# Patient Record
Sex: Male | Born: 1960 | Race: White | Hispanic: No | Marital: Married | Smoking: Former smoker
Health system: Southern US, Community
[De-identification: ages and names within clinical notes are randomized; demographics above are authoritative.]

## PROBLEM LIST (undated history)

## (undated) HISTORY — PX: FOOT SURGERY: SHX648

---

## 2011-10-09 ENCOUNTER — Emergency Department (HOSPITAL_COMMUNITY): Payer: Self-pay

## 2011-10-09 ENCOUNTER — Emergency Department (HOSPITAL_COMMUNITY)
Admission: EM | Admit: 2011-10-09 | Discharge: 2011-10-09 | Disposition: A | Discharge status: Home / Self Care | Payer: Self-pay | Attending: Emergency Medicine | Admitting: Emergency Medicine

## 2011-10-09 ENCOUNTER — Encounter (HOSPITAL_COMMUNITY): Payer: Self-pay

## 2011-10-09 DIAGNOSIS — M545 Low back pain, unspecified: Secondary | ICD-10-CM | POA: Insufficient documentation

## 2011-10-09 DIAGNOSIS — R300 Dysuria: Secondary | ICD-10-CM | POA: Insufficient documentation

## 2011-10-09 DIAGNOSIS — N509 Disorder of male genital organs, unspecified: Secondary | ICD-10-CM | POA: Insufficient documentation

## 2011-10-09 LAB — URINALYSIS, ROUTINE W REFLEX MICROSCOPIC
Hgb urine dipstick: NEGATIVE
Leukocytes, UA: NEGATIVE
Protein, ur: NEGATIVE mg/dL
Specific Gravity, Urine: <1.005 — ABNORMAL LOW (ref 1.005–1.030)
Urobilinogen, UA: 0.2 mg/dL (ref 0.0–1.0)

## 2011-10-09 LAB — CBC
HCT: 44.5 % (ref 39.0–52.0)
Hemoglobin: 15.2 g/dL (ref 13.0–17.0)
MCH: 31.1 pg (ref 26.0–34.0)
MCHC: 34.2 g/dL (ref 30.0–36.0)
MCV: 91.2 fL (ref 78.0–100.0)
RDW: 12.7 % (ref 11.5–15.5)

## 2011-10-09 LAB — DIFFERENTIAL
Basophils Absolute: 0 10*3/uL (ref 0.0–0.1)
Basophils Relative: 1 % (ref 0–1)
Eosinophils Absolute: 0.2 10*3/uL (ref 0.0–0.7)
Eosinophils Relative: 3 % (ref 0–5)
Monocytes Absolute: 0.4 10*3/uL (ref 0.1–1.0)
Monocytes Relative: 7 % (ref 3–12)

## 2011-10-09 LAB — BASIC METABOLIC PANEL
BUN: 8 mg/dL (ref 6–23)
CO2: 28 mEq/L (ref 19–32)
Chloride: 100 mEq/L (ref 96–112)
Creatinine, Ser: 0.74 mg/dL (ref 0.50–1.35)
GFR calc Af Amer: >90 mL/min (ref >90)
Glucose, Bld: 126 mg/dL — ABNORMAL HIGH (ref 70–99)
Potassium: 3.9 mEq/L (ref 3.5–5.1)

## 2011-10-09 MED ORDER — DOXYCYCLINE HYCLATE 100 MG PO TABS
100.0000 mg | ORAL_TABLET | Freq: Once | ORAL | Status: AC
  Administered 2011-10-09: 100 mg via ORAL
  Filled 2011-10-09: qty 1

## 2011-10-09 MED ORDER — SODIUM CHLORIDE 0.9 % IV SOLN
INTRAVENOUS | Status: DC
  Administered 2011-10-09: 16:00:00 via INTRAVENOUS

## 2011-10-09 MED ORDER — DOXYCYCLINE HYCLATE 100 MG PO TABS: 100.0000 mg | ORAL_TABLET | Freq: Two times a day (BID) | ORAL | Status: AC

## 2011-10-09 MED ORDER — OXYCODONE-ACETAMINOPHEN 5-325 MG PO TABS: ORAL_TABLET | ORAL | Status: AC

## 2011-10-09 MED ORDER — CEFTRIAXONE SODIUM 250 MG IJ SOLR
250.0000 mg | Freq: Once | INTRAMUSCULAR | Status: AC
  Administered 2011-10-09: 250 mg via INTRAMUSCULAR
  Filled 2011-10-09: qty 250

## 2011-10-09 MED ORDER — LIDOCAINE HCL (PF) 1 % IJ SOLN
INTRAMUSCULAR | Status: AC
  Administered 2011-10-09: 2.1 mL
  Filled 2011-10-09: qty 5

## 2011-10-09 MED ORDER — MORPHINE SULFATE 4 MG/ML IJ SOLN
4.0000 mg | INTRAMUSCULAR | Status: DC | PRN
  Administered 2011-10-09: 4 mg via INTRAVENOUS
  Filled 2011-10-09: qty 1

## 2011-10-09 NOTE — ED Notes (Signed)
C/o lower back and testicle pain x 2 weeks and dysuria along with hematuria. Pt appears uncomfortable in triage.

## 2011-10-09 NOTE — ED Notes (Signed)
Pt resting quietly on stretcher. States that he can tolerate the pain now and it is better. Waiting on CT.

## 2011-10-09 NOTE — ED Provider Notes (Signed)
History   This chart was scribed for Antonio Anger, DO by Brooks Sailors. The patient was seen in room APA07/APA07. Patient's care was started at 1345.   CSN: [id number]  Arrival date & time 10/09/11  1345   First MD Initiated Contact with Patient 10/09/11 1508      Chief Complaint  Patient presents with  . Hematuria  . Testicle Pain  . Back Pain     HPI Pt seen at 1515.  Per pt, c/o gradual onset and persistence of constant dysuria for the past 2 weeks.  Has been associated with testicular "pain."  Describes the pain as "burning" when he urinates which radiates into his low back and hips.  Pt states he was eval by his PMD 2 days ago for same, does not recall what his doctor dx him with.  Denies abd pain, no N/V/D, no fevers, no rash, no hematuria, no injury.     History reviewed. No pertinent past medical history.  Past Surgical History  Procedure Date  . Foot surgery     left    History  Substance Use Topics  . Smoking status: Current Everyday Smoker  . Smokeless tobacco: Not on file  . Alcohol Use: No      Review of Systems ROS: Statement: All systems negative except as marked or noted in the HPI; Constitutional: Negative for fever and chills. ; ; Eyes: Negative for eye pain, redness and discharge. ; ; ENMT: Negative for ear pain, hoarseness, nasal congestion, sinus pressure and sore throat. ; ; Cardiovascular: Negative for chest pain, palpitations, diaphoresis, dyspnea and peripheral edema. ; ; Respiratory: Negative for cough, wheezing and stridor. ; ; Gastrointestinal: Negative for nausea, vomiting, diarrhea, abdominal pain, blood in stool, hematemesis, jaundice and rectal bleeding. . ; ; Genitourinary: +dysuria.  Negative for flank pain and hematuria. ; Genital:  No penile drainage or rash, +L>R testicular pain and swelling, no scrotal rash or swelling ; Musculoskeletal: +LBP.  Negative for neck pain. Negative for swelling and trauma.; ; Skin: Negative for pruritus,  rash, abrasions, blisters, bruising and skin lesion.; ; Neuro: Negative for headache, lightheadedness and neck stiffness. Negative for weakness, altered level of consciousness , altered mental status, extremity weakness, paresthesias, involuntary movement, seizure and syncope.      Allergies  Fentanyl  Home Medications   Current Outpatient Rx  Name Route Sig Dispense Refill  . CLONAZEPAM 0.5 MG PO TABS Oral Take 0.5 mg by mouth 2 (two) times daily.    . SERTRALINE HCL 50 MG PO TABS Oral Take 50 mg by mouth 2 (two) times daily.      BP 129/69  Pulse 101  Temp 97.6 F (36.4 C) (Oral)  Resp 18  Ht 5\' 11"  (1.803 m)  Wt 157 lb (71.215 kg)  BMI 21.90 kg/m2  SpO2 97%  Physical Exam 1520: Physical examination:  Nursing notes reviewed; Vital signs and O2 SAT reviewed;  Constitutional: Well developed, Well nourished, Well hydrated, Uncomfortable appearing; Head:  Normocephalic, atraumatic; Eyes: EOMI, PERRL, No scleral icterus; ENMT: Mouth and pharynx normal, Mucous membranes moist; Neck: Supple, Full range of motion, No lymphadenopathy; Cardiovascular: Regular rate and rhythm, No murmur, rub, or gallop; Respiratory: Breath sounds clear & equal bilaterally, No rales, rhonchi, wheezes.  Speaking full sentences with ease, Normal respiratory effort/excursion; Chest: Nontender, Movement normal; Abdomen: Soft, Nontender, Nondistended, Normal bowel sounds; Genitourinary: No CVA tenderness; Genital and rectal exam performed with pt permission and male ED RN chaparone present during exam.  No perineal tenderness, erythema, edema, ecchymosis, or soft tissue crepitus.  No penile lesions or drainage.  No scrotal erythema, edema or tenderness to palp.  Normal testicular lie.  No testicular tenderness to palp.  +cremasteric reflexes bilat.  No inguinal LAN or palpable masses.  Rectal exam performed w/permission of pt and ED RN chaparone present.  Anal tone normal. Prostate NT on rectal exam.  Non-tender, soft  brown stool in rectal vault, heme neg.  No fissures, no external hemorrhoids, no palp masses.;;  Spine:  No midline CS, TS, LS tenderness.; Extremities: Pulses normal, No tenderness, No edema, No calf edema or asymmetry.; Neuro: AA&Ox3, Major CN grossly intact.  Speech clear. Normal coordination, gait steady.  Climbs on and off stretcher without difficulty or distress.  No gross focal motor or sensory deficits in extremities.; Skin: Color normal, Warm, Dry.   ED Course  Procedures   MDM  MDM Reviewed: nursing note and vitals Interpretation: labs, ultrasound and CT scan   Results for orders placed during the hospital encounter of 10/09/11  URINALYSIS, ROUTINE W REFLEX MICROSCOPIC      Component Value Range   Color, Urine YELLOW  YELLOW   APPearance CLEAR  CLEAR   Specific Gravity, Urine <1.005 (*) 1.005 - 1.030   pH 6.0  5.0 - 8.0   Glucose, UA NEGATIVE  NEGATIVE mg/dL   Hgb urine dipstick NEGATIVE  NEGATIVE   Bilirubin Urine NEGATIVE  NEGATIVE   Ketones, ur NEGATIVE  NEGATIVE mg/dL   Protein, ur NEGATIVE  NEGATIVE mg/dL   Urobilinogen, UA 0.2  0.0 - 1.0 mg/dL   Nitrite NEGATIVE  NEGATIVE   Leukocytes, UA NEGATIVE  NEGATIVE  BASIC METABOLIC PANEL      Component Value Range   Sodium 137  135 - 145 mEq/L   Potassium 3.9  3.5 - 5.1 mEq/L   Chloride 100  96 - 112 mEq/L   CO2 28  19 - 32 mEq/L   Glucose, Bld 126 (*) 70 - 99 mg/dL   BUN 8  6 - 23 mg/dL   Creatinine, Ser 1.61  0.50 - 1.35 mg/dL   Calcium 9.7  8.4 - 09.6 mg/dL   GFR calc non Af Amer >90  >90 mL/min   GFR calc Af Amer >90  >90 mL/min  CBC      Component Value Range   WBC 6.6  4.0 - 10.5 K/uL   RBC 4.88  4.22 - 5.81 MIL/uL   Hemoglobin 15.2  13.0 - 17.0 g/dL   HCT 04.5  40.9 - 81.1 %   MCV 91.2  78.0 - 100.0 fL   MCH 31.1  26.0 - 34.0 pg   MCHC 34.2  30.0 - 36.0 g/dL   RDW 91.4  78.2 - 95.6 %   Platelets 229  150 - 400 K/uL  DIFFERENTIAL      Component Value Range   Neutrophils Relative 61  43 - 77 %    Neutro Abs 4.0  1.7 - 7.7 K/uL   Lymphocytes Relative 30  12 - 46 %   Lymphs Abs 2.0  0.7 - 4.0 K/uL   Monocytes Relative 7  3 - 12 %   Monocytes Absolute 0.4  0.1 - 1.0 K/uL   Eosinophils Relative 3  0 - 5 %   Eosinophils Absolute 0.2  0.0 - 0.7 K/uL   Basophils Relative 1  0 - 1 %   Basophils Absolute 0.0  0.0 - 0.1 K/uL   Ct Abdomen  Pelvis Wo Contrast 10/09/2011  *RADIOLOGY REPORT*  Clinical Data: Hematuria, testicular pain  CT ABDOMEN AND PELVIS WITHOUT CONTRAST  Technique:  Multidetector CT imaging of the abdomen and pelvis was performed following the standard protocol without intravenous contrast.  Comparison: None.  Findings: Lung bases are unremarkable.  Mild degenerative changes lumbar spine.  Schmorl's node deformity noted upper endplate of the L5 and upper endplate of the T12 vertebral body.  Unenhanced liver, spleen, pancreas and adrenals are unremarkable. No calcified gallstones are noted within gallbladder.  Unenhanced kidneys shows no nephrolithiasis.  No hydronephrosis or hydroureter.  There is a probable cyst anterior aspect of the right kidney lower pole measures 3.2 cm.  No calcified ureteral calculi are noted bilaterally.  Mild atherosclerotic calcifications distal abdominal aorta and the iliac arteries.  No aortic aneurysm.  There is no pericecal inflammation.  Normal appendix is partially visualized in axial image 55.  No small bowel obstruction.  No ascites or free air.  No adenopathy.  Moderate distended urinary bladder is noted.  Prostate gland measures 4.9 x 3.3 cm.  No pelvic ascites or adenopathy.  No distal colonic obstruction. No calcified calculi are noted within urinary bladder.  No inguinal adenopathy.  IMPRESSION:  1.  No nephrolithiasis. 2.  No hydronephrosis or hydroureter.  No calcified ureteral calculi are noted. 3.  Normal appendix is partially visualized. 4.  Moderate distended urinary bladder without evidence of calcified calculi.  Original Report Authenticated By:  Natasha Mead, M.D.   US Scrotum 10/09/2011  *RADIOLOGY REPORT*  Clinical Data:  Testicular pain  SCROTAL ULTRASOUND DOPPLER ULTRASOUND OF THE TESTICLES  Technique: Complete ultrasound examination of the testicles, epididymis, and other scrotal structures was performed.  Color and spectral Doppler ultrasound were also utilized to evaluate blood flow to the testicles.  Comparison:  None.  Findings:  Right testis:  Measures 5.3 x 2.2 x 3.5 cm.  No focal abnormality or intratesticular mass.  Normal color Doppler flow with arterial and venous waveforms visualized.  No evidence of torsion.  Left testis:  Measures 4.7 x 1.9 x 3.1 cm.  No focal abnormality or intratesticular mass.  Normal color Doppler flow with arterial and venous waveforms detected.  Negative for torsion.  Right epididymis:  Normal in size and appearance.  Left epididymis:  Normal in size in appearance.  Incidental 3 mm cyst noted.  Hydocele:  Trace simple right hydrocele.  No left hydrocele demonstrated.  Varicocele:  Negative  Pulsed Doppler interrogation of both testes demonstrates low resistance flow bilaterally.  IMPRESSION: No acute finding by ultrasound or evidence of torsion.  Original Report Authenticated By: Judie Petit. Ruel Favors, M.D.     7:03 PM:   Pt states he feels better now and wants to go home.  UC is pending.  No acute findings on CT, [place], or labs today to account for pt's pain.  Will tx for dysuria with IV and PO abx, and f/u with Uro MD.  Dx testing d/w pt and family.  Questions answered.  Verb understanding, agreeable to d/c home with outpt f/u.         I personally performed the services described in this documentation, which was scribed in my presence. The recorded information has been reviewed and considered. Khair Chasteen Allison Quarry, DO 10/12/11 1623

## 2011-10-09 NOTE — ED Notes (Signed)
Pt c/o pain in his left testicle, bilateral hips and lower back. Pt states that is hurts to urinate. Pt alert and oriented x 3. Skin warm and dry. Color pink. Breath sounds clear and equal bilaterally.

## 2011-10-09 NOTE — ED Notes (Signed)
Pt lying on stretcher resting quietly. States that the pain is starting to get a little worse again.

## 2011-10-09 NOTE — Discharge Instructions (Signed)
RESOURCE GUIDE  Chronic Pain Problems: Contact [facility]  [phone] Patients need to be referred by their primary care doctor.  Insufficient Money for Medicine: Contact United Way:  call "211" or [facility] [phone].  No Primary Care Doctor: - Call [facility]  [phone] - can help you locate a primary care doctor that  accepts your insurance, provides certain services, etc. - Physician Referral Service- 1-800-533-3463  Agencies that provide inexpensive medical care: - [facility]  [phone] - [facility]  [phone] - [facility]  [phone] - [facility]  [phone] - Planned Parenthood  [phone] - [facility]  [phone]  Medicaid-accepting [place] Providers: - [facility]- [address]  [phone], Mon-Fri 9am-7pm, Sat 9am-1pm - [facility]- [address], [address]  [phone] - [facility]- [address], [address]  [phone] - [facility]- [address] [place] [address]  [phone] - Veita Bland- [address], [phone]  Only accepts [place] Access Medicaid patients after they have their name  applied to their card  Self Pay (no insurance) in [place]: - Sickle Cell Patients: Dr Eric Dean, [facility]  [address], [phone] - [facility]- [address]  [phone]       -     [facility]- [address]       -     [facility]- see information above (Speak to Pam H if you do not have insurance)       -  [facility]- [address], [phone]       -  [facility] [place]- [address],  [phone]       -  [facility]- [address] [place] [address], [phone]       -  Dr Osei-Bonsu-  [address], [place], [phone]       -  [facility]- [address], [phone]       -  [facility]- [address] [place] [address], [phone], also [address], [phone]       -    [facility]- [address], [phone], 1st & 3rd Saturday   every month, 10am-1pm  1) Find a Doctor and Pay Out of Pocket Although you won't have to find out who is covered by your insurance plan, it is a good idea to ask around and get recommendations. You will then need to call the office and see if the doctor you have chosen will accept you as a new patient and what types of options they offer for patients who are self-pay. Some doctors offer discounts or will set up payment plans for their patients who do not have insurance, but you will need to ask so you aren't surprised when you get to your appointment.  2) Contact Your Local Health Department Not all health departments have doctors that can see patients for sick visits, but many do, so it is worth a call to see if yours does. If you don't know where your local health department is, you can check in your phone book. The CDC also has a tool to help you locate your state's health department, and many state websites also have   listings of all of their local health departments.  3) Find a Walk-in Clinic If your illness is not likely to be very severe or complicated, you may want to try a walk in clinic. These are popping up all over the country in pharmacies, drugstores, and shopping centers. They're usually staffed by nurse practitioners or physician assistants that have been trained to treat common illnesses and complaints. They're usually fairly quick and inexpensive. However, if you have serious medical issues or chronic medical problems, these are probably not your best option  STD Testing - [place] [facility] [place], [facility] Clinic, [address], [place], phone [phone] or [phone].  Monday - Friday, call for an appointment. - [place]  [facility] [place], [facility]D Clinic, [address], [place], phone [phone] or 1-877-539-9860.  Monday - Friday, call for an appointment.  Abuse/Neglect: - [place] Child Abuse Hotline (336) 641-3795 - [place] Child Abuse Hotline 800-378-5315 (After Hours)  Emergency Shelter:  [facility] (336) 271-5985  Maternity Homes: - [facility] (336) 275-9566 - [facility] (704) 372-4663  MRSA Hotline #:   832-7006  [place] Resources  [facility] [place]  [employer] [place] [facility] [address]                 [address]         [address]  [place]                                               [place]                              [place] Phone:  [phone]                                  Phone:  [phone]                   Phone:  [phone]  [place] [facility], [phone] - [place] Services - [facility]- 1-888-581-9988       -     [facility] in [place], [address],                                  336-349-4454, Insurance  [place] Child Abuse Hotline (336) 342-1394 or (336) 342-3537 (After Hours)   Behavioral Health Services  Substance Abuse Resources: - [facility]  336-882-2125 - [facility] 336-784-9470 - [facility] 336-285-9073 - [facility] 336-845-3988 - Residential & Outpatient Substance Abuse Program  800-659-3381  Psychological Services: -  Health  [phone] - [facility]  [phone] - [place] [facility], [address], [place], ACCESS LINE: 1-800-853-5163 or 336-641-4981, Http://www.[facility].com/services/adult.htm  Dental Assistance  If unable to pay or uninsured, contact:  [facility] or [place] [facility] to become qualified for the adult dental  clinic.  Patients with Medicaid:  [facility] [address], [phone], [phone]  If unable   to pay, or uninsured, contact [facility] (204)775-4050) or [facility] Department [phone] in [place], [phone] in [facility]) to become qualified for the adult dental clinic  Other Low-Cost Community Dental Services: - [facility]- [address] [place], [place], [place], [address], [phone], [address]. [phone], 2nd and 4th Thursday of the month at 6:30am.  10 clients each day by appointment, can sometimes see walk-in patients if someone does not show for an appointment. [facility]- [address] [place], [place], [address], [phone] - [facility]- [address] [place], [place], [address], [phone] [facility]- [phone] [facility]- [phone] [facility] [phone]     Take the prescriptions as directed.  Apply moist heat or ice to the area(s) of discomfort, for 15 minutes at a time, several times per day for the next few days.  Do not fall asleep on a heating or ice pack.  Call your regular medical doctor and the Urologist tomorrow morning to schedule a follow up appointment within the next 4 days.  Return to the Emergency Department immediately sooner if worsening.

## 2011-10-10 LAB — URINE CULTURE: Culture: NO GROWTH

## 2012-09-24 ENCOUNTER — Emergency Department: Payer: Self-pay | Admitting: Emergency Medicine

## 2012-09-24 LAB — CBC
HGB: 13.8 g/dL (ref 13.0–18.0)
MCHC: 34.3 g/dL (ref 32.0–36.0)
MCV: 90 fL (ref 80–100)
Platelet: 232 10*3/uL (ref 150–440)
RBC: 4.48 10*6/uL (ref 4.40–5.90)

## 2012-09-24 LAB — BASIC METABOLIC PANEL
Chloride: 109 mmol/L — ABNORMAL HIGH (ref 98–107)
Co2: 27 mmol/L (ref 21–32)
Creatinine: 0.68 mg/dL (ref 0.60–1.30)
EGFR (African American): >60
Glucose: 94 mg/dL (ref 65–99)
Potassium: 4.2 mmol/L (ref 3.5–5.1)
Sodium: 141 mmol/L (ref 136–145)

## 2013-04-02 ENCOUNTER — Emergency Department (HOSPITAL_COMMUNITY)
Admission: EM | Admit: 2013-04-02 | Discharge: 2013-04-02 | Disposition: A | Discharge status: Home / Self Care | Payer: BC Managed Care – PPO | Attending: Emergency Medicine | Admitting: Emergency Medicine

## 2013-04-02 ENCOUNTER — Encounter (HOSPITAL_COMMUNITY): Payer: Self-pay | Admitting: Emergency Medicine

## 2013-04-02 DIAGNOSIS — F17200 Nicotine dependence, unspecified, uncomplicated: Secondary | ICD-10-CM | POA: Insufficient documentation

## 2013-04-02 DIAGNOSIS — L723 Sebaceous cyst: Secondary | ICD-10-CM | POA: Insufficient documentation

## 2013-04-02 DIAGNOSIS — Z79899 Other long term (current) drug therapy: Secondary | ICD-10-CM | POA: Insufficient documentation

## 2013-04-02 MED ORDER — SULFAMETHOXAZOLE-TRIMETHOPRIM 800-160 MG PO TABS: 1.0000 | ORAL_TABLET | Freq: Two times a day (BID) | ORAL | Status: AC

## 2013-04-02 MED ORDER — LIDOCAINE HCL (PF) 2 % IJ SOLN
INTRAMUSCULAR | Status: AC
  Administered 2013-04-02: 15:00:00
  Filled 2013-04-02: qty 10

## 2013-04-02 MED ORDER — HYDROCODONE-ACETAMINOPHEN 5-325 MG PO TABS
ORAL_TABLET | ORAL | Status: AC
Start: 2013-04-02 — End: ?

## 2013-04-02 NOTE — ED Provider Notes (Signed)
Medical screening examination/treatment/procedure(s) were performed by non-physician practitioner and as supervising physician I was immediately available for consultation/collaboration.     Geoffery Lyons, MD 04/02/13 [phone]

## 2013-04-02 NOTE — ED Provider Notes (Signed)
CSN: [id number]     Arrival date & time 04/02/13  1405 History   First MD Initiated Contact with Patient 04/02/13 1411     Chief Complaint  Patient presents with  . Abscess   (Consider location/radiation/quality/duration/timing/severity/associated sxs/prior Treatment) Patient is a 52 y.o. male presenting with abscess. The history is provided by the patient.  Abscess Location:  Shoulder/arm Shoulder/arm abscess location: left scapula. Abscess quality: induration, painful and redness   Abscess quality: not draining, no fluctuance and no warmth   Red streaking: no   Duration:  3 days Progression:  Worsening Pain details:    Quality:  Pressure and throbbing   Severity:  Moderate   Timing:  Constant   Progression:  Worsening Chronicity:  New Context: not diabetes, not immunosuppression and not skin injury   Relieved by:  Nothing Exacerbated by: palpation. Ineffective treatments:  None tried Associated symptoms: no fever, no headaches, no nausea and no vomiting   Risk factors: no hx of MRSA and no prior abscess     History reviewed. No pertinent past medical history. Past Surgical History  Procedure Laterality Date  . Foot surgery      left   No family history on file. History  Substance Use Topics  . Smoking status: Current Every Day Smoker  . Smokeless tobacco: Not on file  . Alcohol Use: No    Review of Systems  Constitutional: Negative for fever and chills.  Gastrointestinal: Negative for nausea and vomiting.  Musculoskeletal: Negative for arthralgias and joint swelling.  Skin: Positive for color change.       Abscess   Neurological: Negative for headaches.  Hematological: Negative for adenopathy.  All other systems reviewed and are negative.    Allergies  Fentanyl  Home Medications   Current Outpatient Rx  Name  Route  Sig  Dispense  Refill  . clonazePAM (KLONOPIN) 0.5 MG tablet   Oral   Take 0.5 mg by mouth 2 (two) times daily.         .  sertraline (ZOLOFT) 50 MG tablet   Oral   Take 50 mg by mouth 2 (two) times daily.          BP 122/74  Pulse 91  Temp(Src) 98.1 F (36.7 C) (Oral)  Resp 18  Ht 5\' 11"  (1.803 m)  Wt 157 lb (71.215 kg)  BMI 21.91 kg/m2  SpO2 95% Physical Exam  Nursing note and vitals reviewed. Constitutional: He is oriented to person, place, and time. He appears well-developed and well-nourished. No distress.  HENT:  Head: Normocephalic and atraumatic.  Cardiovascular: Normal rate, regular rhythm and normal heart sounds.   No murmur heard. Pulmonary/Chest: Effort normal and breath sounds normal. No respiratory distress.  Musculoskeletal: Normal range of motion.  Neurological: He is alert and oriented to person, place, and time. He exhibits normal muscle tone. Coordination normal.  Skin: Skin is warm and dry. There is erythema.  Localized area of erythema and induration to the upper left scapula.  No lymphangitis.  Pt has multiple open comedones to the upper back.     ED Course  Procedures (including critical care time) Labs Review Labs Reviewed - No data to display Imaging Review No results found.  EKG Interpretation   None       MDM   INCISION AND DRAINAGE Performed by: Pauline Aus L. Consent: Verbal consent obtained. Risks and benefits: risks, benefits and alternatives were discussed Type: abscess  Body area: left scapula Anesthesia: local infiltration  Incision  was made with a  #11 scalpel.  Local anesthetic: lidocaine 2 % w/o epinephrine  Anesthetic total: 3 ml  Complexity: complex Blunt dissection to break up loculations  Drainage: purulent  Drainage amount: moderate  Packing material: 1/4 in iodoform gauze  Patient tolerance: Patient tolerated the procedure well with no immediate complications.   Patient with likely infected sebaceous cyst of the upper left scapula.  VSS.  NV intact.  Pain improved after I&D.  Family member agrees to remove packing in 2  days or to return here for any worsening symptoms.  Appears stable for discharge.   Leilan Bochenek L. Diera Wirkkala, PA-C 04/02/13 1505

## 2013-04-02 NOTE — ED Notes (Signed)
Pt c/o ?abscess to left scapula area that became worse 3 days ago,

## 2013-04-04 ENCOUNTER — Encounter (HOSPITAL_COMMUNITY): Payer: Self-pay | Admitting: Emergency Medicine

## 2013-04-04 ENCOUNTER — Emergency Department (HOSPITAL_COMMUNITY)
Admission: EM | Admit: 2013-04-04 | Discharge: 2013-04-04 | Disposition: A | Discharge status: Home / Self Care | Payer: BC Managed Care – PPO | Attending: Emergency Medicine | Admitting: Emergency Medicine

## 2013-04-04 DIAGNOSIS — Z792 Long term (current) use of antibiotics: Secondary | ICD-10-CM | POA: Insufficient documentation

## 2013-04-04 DIAGNOSIS — L989 Disorder of the skin and subcutaneous tissue, unspecified: Secondary | ICD-10-CM | POA: Insufficient documentation

## 2013-04-04 DIAGNOSIS — Z5189 Encounter for other specified aftercare: Secondary | ICD-10-CM

## 2013-04-04 DIAGNOSIS — Z4802 Encounter for removal of sutures: Secondary | ICD-10-CM | POA: Insufficient documentation

## 2013-04-04 DIAGNOSIS — Z79899 Other long term (current) drug therapy: Secondary | ICD-10-CM | POA: Insufficient documentation

## 2013-04-04 DIAGNOSIS — F17200 Nicotine dependence, unspecified, uncomplicated: Secondary | ICD-10-CM | POA: Insufficient documentation

## 2013-04-04 MED ORDER — KETOROLAC TROMETHAMINE 60 MG/2ML IM SOLN
60.0000 mg | Freq: Once | INTRAMUSCULAR | Status: AC
  Administered 2013-04-04: 60 mg via INTRAMUSCULAR
  Filled 2013-04-04: qty 2

## 2013-04-04 MED ORDER — DEXAMETHASONE SODIUM PHOSPHATE 4 MG/ML IJ SOLN
8.0000 mg | Freq: Once | INTRAMUSCULAR | Status: AC
  Administered 2013-04-04: 8 mg via INTRAMUSCULAR
  Filled 2013-04-04: qty 2

## 2013-04-04 NOTE — ED Notes (Signed)
Patient had I&D to abscess on left shoulder Thursday. Patient c/o pain and swelling at I&D site. Denies any fevers. Wife reports patient had foul odor drainage Sunday morning from site. Packing in place.

## 2013-04-04 NOTE — ED Provider Notes (Signed)
CSN: [id number]     Arrival date & time 04/04/13  1028 History   First MD Initiated Contact with Patient 04/04/13 1150     Chief Complaint  Patient presents with  . Wound Check   (Consider location/radiation/quality/duration/timing/severity/associated sxs/prior Treatment) Patient is a 52 y.o. male presenting with wound check. The history is provided by the patient.  Wound Check The current episode started in the past 7 days. The problem has been gradually improving. Pertinent negatives include no abdominal pain, arthralgias, chest pain, coughing, fatigue, fever, nausea, neck pain or vomiting. Associated symptoms comments: Left shoulder soreness. Nothing aggravates the symptoms. Treatments tried: antibiotic and pain medication. The treatment provided mild relief.    History reviewed. No pertinent past medical history. Past Surgical History  Procedure Laterality Date  . Foot surgery      left   Family History  Problem Relation Age of Onset  . Heart attack Mother   . Stroke Mother   . Cancer Brother    History  Substance Use Topics  . Smoking status: Current Every Day Smoker -- 1.00 packs/day for 38 years    Types: Cigarettes  . Smokeless tobacco: Never Used  . Alcohol Use: No    Review of Systems  Constitutional: Negative for fever, activity change and fatigue.       All ROS Neg except as noted in HPI  HENT: Negative for nosebleeds.   Eyes: Negative for photophobia and discharge.  Respiratory: Negative for cough, shortness of breath and wheezing.   Cardiovascular: Negative for chest pain and palpitations.  Gastrointestinal: Negative for nausea, vomiting, abdominal pain and blood in stool.  Genitourinary: Negative for dysuria, frequency and hematuria.  Musculoskeletal: Negative for arthralgias, back pain and neck pain.  Skin: Negative.   Neurological: Negative for dizziness, seizures and speech difficulty.  Psychiatric/Behavioral: Negative for hallucinations and confusion.     Allergies  Fentanyl  Home Medications   Current Outpatient Rx  Name  Route  Sig  Dispense  Refill  . clonazePAM (KLONOPIN) 0.5 MG tablet   Oral   Take 0.5 mg by mouth 2 (two) times daily.         Marland Kitchen HYDROcodone-acetaminophen (NORCO/VICODIN) 5-325 MG per tablet      Take one-two tabs po q 4-6 hrs prn pain   20 tablet   0   . sertraline (ZOLOFT) 50 MG tablet   Oral   Take 50 mg by mouth 2 (two) times daily.         Marland Kitchen sulfamethoxazole-trimethoprim (SEPTRA DS) 800-160 MG per tablet   Oral   Take 1 tablet by mouth 2 (two) times daily.   28 tablet   0    BP 146/86  Pulse 97  Temp(Src) 98 F (36.7 C) (Oral)  Resp 16  Ht 5\' 11"  (1.803 m)  Wt 158 lb (71.668 kg)  BMI 22.05 kg/m2  SpO2 100% Physical Exam  Nursing note and vitals reviewed. Constitutional: He is oriented to person, place, and time. He appears well-developed and well-nourished.  Non-toxic appearance.  HENT:  Head: Normocephalic.  Right Ear: Tympanic membrane and external ear normal.  Left Ear: Tympanic membrane and external ear normal.  Eyes: EOM and lids are normal. Pupils are equal, round, and reactive to light.  Neck: Normal range of motion. Neck supple. Carotid bruit is not present.  Cardiovascular: Normal rate, regular rhythm, normal heart sounds, intact distal pulses and normal pulses.   Pulmonary/Chest: Breath sounds normal. No respiratory distress.  Abdominal: Soft. Bowel  sounds are normal. There is no tenderness. There is no guarding.  Musculoskeletal: Normal range of motion.  The wound to the left shoulder is progressing nicely. No red streaking. The abscess area nor the shoulder are hot to touch. Pain with ROM of the left shoulder.  Lymphadenopathy:       Head (right side): No submandibular adenopathy present.       Head (left side): No submandibular adenopathy present.    He has no cervical adenopathy.  Neurological: He is alert and oriented to person, place, and time. He has normal  strength. No cranial nerve deficit or sensory deficit.  Skin: Skin is warm and dry.  Psychiatric: He has a normal mood and affect. His speech is normal.    ED Course  Procedures (including critical care time) Labs Review  Packing removed by me without problem.  Labs Reviewed - No data to display Imaging Review No results found.  EKG Interpretation   None       MDM  No diagnosis found. *I have reviewed nursing notes, vital signs, and all appropriate lab and imaging results for this patient.**  Pt has soreness of the left shoulder under the scapula. No hot joint present. No deformity. Wound progressing nicely. Packing removed. Pt given IM decadron and toradol  For his discomfort. Pt to follow up with PCP if any changes or problem, or to ED if any Emergent changes.  Kathie Dike, PA-C 04/04/13 2059

## 2013-04-04 NOTE — ED Notes (Signed)
Patient with no complaints at this time. Respirations even and unlabored. Skin warm/dry. Discharge instructions reviewed with patient at this time. Patient given opportunity to voice concerns/ask questions. Patient discharged at this time and left Emergency Department with steady gait. Wound dressed with sterile gauzed and secured with tape. Wound care instructions reinforced and pt/family member gave verbal understanding.

## 2013-04-06 NOTE — ED Provider Notes (Signed)
Medical screening examination/treatment/procedure(s) were performed by non-physician practitioner and as supervising physician I was immediately available for consultation/collaboration.  EKG Interpretation   None         Shelda Jakes, MD 04/06/13 [phone]

## 2014-05-01 ENCOUNTER — Encounter (HOSPITAL_COMMUNITY): Payer: Self-pay | Admitting: *Deleted

## 2014-05-01 ENCOUNTER — Emergency Department (HOSPITAL_COMMUNITY)
Admission: EM | Admit: 2014-05-01 | Discharge: 2014-05-01 | Disposition: A | Discharge status: Home / Self Care | Payer: BLUE CROSS/BLUE SHIELD | Attending: Emergency Medicine | Admitting: Emergency Medicine

## 2014-05-01 DIAGNOSIS — R51 Headache: Secondary | ICD-10-CM | POA: Diagnosis present

## 2014-05-01 DIAGNOSIS — I889 Nonspecific lymphadenitis, unspecified: Secondary | ICD-10-CM | POA: Diagnosis not present

## 2014-05-01 DIAGNOSIS — Z792 Long term (current) use of antibiotics: Secondary | ICD-10-CM | POA: Insufficient documentation

## 2014-05-01 DIAGNOSIS — Z79899 Other long term (current) drug therapy: Secondary | ICD-10-CM | POA: Insufficient documentation

## 2014-05-01 DIAGNOSIS — Z87891 Personal history of nicotine dependence: Secondary | ICD-10-CM | POA: Diagnosis not present

## 2014-05-01 MED ORDER — CEPHALEXIN 500 MG PO CAPS
500.0000 mg | ORAL_CAPSULE | Freq: Once | ORAL | Status: AC
Start: 2014-05-01 — End: 2014-05-01
  Administered 2014-05-01: 500 mg via ORAL
  Filled 2014-05-01: qty 1

## 2014-05-01 MED ORDER — IBUPROFEN 800 MG PO TABS
800.0000 mg | ORAL_TABLET | Freq: Once | ORAL | Status: AC
  Administered 2014-05-01: 800 mg via ORAL
  Filled 2014-05-01: qty 1

## 2014-05-01 MED ORDER — IBUPROFEN 800 MG PO TABS: 800.0000 mg | ORAL_TABLET | Freq: Three times a day (TID) | ORAL | Status: AC

## 2014-05-01 MED ORDER — CEPHALEXIN 500 MG PO CAPS: 500.0000 mg | ORAL_CAPSULE | Freq: Four times a day (QID) | ORAL | Status: AC

## 2014-05-01 NOTE — Discharge Instructions (Signed)
[facility] Primary Care Doctor List    Kari Baars MD. Specialty: Pulmonary Disease Contact information: [address]  [address]  [place] [address]  [phone]   Syliva Overman, MD. Specialty: [facility] Contact information: [address]  [place] [address]  [phone]   Lilyan Punt, MD. Specialty: [facility] Medicine Contact information: [address]  [place] [address]  [phone]   Avon Gully, MD Specialty: Internal Medicine Contact information: [address] [place] [address]  [phone]   Catalina Pizza, MD. Specialty: Internal Medicine Contact information: [address]  [place] [address]  [phone]   Butch Penny, MD. Specialty: Family Medicine Contact information: [address]  [place] [address]  [phone]   John Giovanni, MD. Specialty: [facility] Contact information: [address]  [address]  [place] [address]  [phone]   Carylon Perches, MD. Specialty: Internal Medicine Contact information: [address]  [address]  [place] [address]  [phone]     Lymphadenopathy Lymphadenopathy means "disease of the lymph glands." But the term is usually used to describe swollen or enlarged lymph glands, also called lymph nodes. These are the bean-shaped organs found in many locations including the neck, underarm, and groin. Lymph glands are part of the immune system, which fights infections in your body. Lymphadenopathy can occur in just one area of the body, such as the neck, or it can be generalized, with lymph node enlargement in several areas. The nodes found in the neck are the most common sites of lymphadenopathy. CAUSES When your immune system responds to germs (such as viruses or bacteria ), infection-fighting cells and fluid build up. This causes the glands to grow in size. Usually, this is not something to worry about. Sometimes, the glands  themselves can become infected and inflamed. This is called lymphadenitis. Enlarged lymph nodes can be caused by many diseases:  Bacterial disease, such as strep throat or a skin infection.  Viral disease, such as a common cold.  Other germs, such as Lyme disease, tuberculosis, or sexually transmitted diseases.  Cancers, such as lymphoma (cancer of the lymphatic system) or leukemia (cancer of the white blood cells).  Inflammatory diseases such as lupus or rheumatoid arthritis.  Reactions to medications. Many of the diseases above are rare, but important. This is why you should see your caregiver if you have lymphadenopathy. SYMPTOMS  Swollen, enlarged lumps in the neck, back of the head, or other locations.  Tenderness.  Warmth or redness of the skin over the lymph nodes.  Fever. DIAGNOSIS Enlarged lymph nodes are often near the source of infection. They can help health care providers diagnose your illness. For instance:  Swollen lymph nodes around the jaw might be caused by an infection in the mouth.  Enlarged glands in the neck often signal a throat infection.  Lymph nodes that are swollen in more than one area often indicate an illness caused by a virus. Your caregiver will likely know what is causing your lymphadenopathy after listening to your history and examining you. Blood tests, x-rays, or other tests may be needed. If the cause of the enlarged lymph node cannot be found, and it does not go away by itself, then a biopsy may be needed. Your caregiver will discuss this with you. TREATMENT Treatment for your enlarged lymph nodes will depend on the cause. Many times the nodes will shrink to normal size by themselves,  with no treatment. Antibiotics or other medicines may be needed for infection. Only take over-the-counter or prescription medicines for pain, discomfort, or fever as directed by your caregiver. HOME CARE INSTRUCTIONS Swollen lymph glands usually return to normal  when the underlying medical condition goes away. If they persist, contact your health-care provider. He/she might prescribe antibiotics or other treatments, depending on the diagnosis. Take any medications exactly as prescribed. Keep any follow-up appointments made to check on the condition of your enlarged nodes. SEEK MEDICAL CARE IF:  Swelling lasts for more than two weeks.  You have symptoms such as weight loss, night sweats, fatigue, or fever that does not go away.  The lymph nodes are hard, seem fixed to the skin, or are growing rapidly.  Skin over the lymph nodes is red and inflamed. This could mean there is an infection. SEEK IMMEDIATE MEDICAL CARE IF:  Fluid starts leaking from the area of the enlarged lymph node.  You develop a fever of 102 F (38.9 C) or greater.  Severe pain develops (not necessarily at the site of a large lymph node).  You develop chest pain or shortness of breath.  You develop worsening abdominal pain. MAKE SURE YOU:  Understand these instructions.  Will watch your condition.  Will get help right away if you are not doing well or get worse. Document Released: 01/22/2008 Document Revised: 08/29/2013 Document Reviewed: 01/22/2008 [facility] Patient Information 2015 [place], [place]. This information is not intended to replace advice given to you by your health care provider. Make sure you discuss any questions you have with your health care provider.

## 2014-05-01 NOTE — ED Provider Notes (Signed)
CSN: [id number]     Arrival date & time 05/01/14  1844 History  This chart was scribed for Vida Roller, MD by Annye Asa, ED Scribe. This patient was seen in room APA11/APA11 and the patient's care was started at 8:19 PM.    Chief Complaint  Patient presents with  . Headache   The history is provided by the patient. No language interpreter was used.     HPI Comments: Antonio Turner is a 54 y.o. male who presents to the Emergency Department complaining of 4 days of a painful lump to the right side of his chin; this pain radiates around his head. He also notes an area of "soreness" on the right side of the back of his head and neck. He denies rashes, ear pain.   Patient is a former smoker (quit date 4 days PTA); also used chewing tobacco.   History reviewed. No pertinent past medical history. Past Surgical History  Procedure Laterality Date  . Foot surgery      left   Family History  Problem Relation Age of Onset  . Heart attack Mother   . Stroke Mother   . Cancer Brother    History  Substance Use Topics  . Smoking status: Former Smoker -- 1.00 packs/day for 38 years    Types: Cigarettes  . Smokeless tobacco: Never Used  . Alcohol Use: No    Review of Systems  Musculoskeletal: Positive for neck pain.  Neurological: Positive for headaches.  All other systems reviewed and are negative.   Allergies  Fentanyl  Home Medications   Prior to Admission medications   Medication Sig Start Date End Date Taking? Authorizing Provider  cephALEXin (KEFLEX) 500 MG capsule Take 1 capsule (500 mg total) by mouth 4 (four) times daily. 05/01/14   Vida Roller, MD  clonazePAM (KLONOPIN) 0.5 MG tablet Take 0.5 mg by mouth 2 (two) times daily.    Historical Provider, MD  HYDROcodone-acetaminophen (NORCO/VICODIN) 5-325 MG per tablet Take one-two tabs po q 4-6 hrs prn pain 04/02/13   Tammy L. Triplett, PA-C  ibuprofen (ADVIL,MOTRIN) 800 MG tablet Take 1 tablet (800 mg total) by mouth 3  (three) times daily. 05/01/14   Vida Roller, MD  sertraline (ZOLOFT) 50 MG tablet Take 50 mg by mouth 2 (two) times daily.    Historical Provider, MD  sulfamethoxazole-trimethoprim (SEPTRA DS) 800-160 MG per tablet Take 1 tablet by mouth 2 (two) times daily. 04/02/13   Tammy L. Triplett, PA-C   BP 161/100 mmHg  Pulse 99  Temp(Src) 98.1 F (36.7 C) (Oral)  Resp 20  Ht  (1.803 m)  Wt 150 lb (68.04 kg)  BMI 20.93 kg/m2  SpO2 100% Physical Exam  Constitutional: He appears well-developed and well-nourished.  HENT:  Head: Normocephalic and atraumatic.  TMs are normal and pharynx is clear. Dentition without obvious abscesses, normal appearing sublingual area.   Eyes: Conjunctivae are normal. Right eye exhibits no discharge. Left eye exhibits no discharge.  Neck: Neck supple.  Right submandibular 1cm rubbery, mobile, tender lymphnode   Pulmonary/Chest: Effort normal. No respiratory distress.  Lymphadenopathy:  No other lymphadenopathy except for noted   Neurological: He is alert. Coordination normal.  Skin: Skin is warm and dry. No rash noted. He is not diaphoretic. No erythema.  Psychiatric: He has a normal mood and affect.  Nursing note and vitals reviewed.   ED Course  Procedures   DIAGNOSTIC STUDIES: Oxygen Saturation is 100% on RA, normal by my  interpretation.    COORDINATION OF CARE: 8:24 PM Discussed treatment plan with pt at bedside and pt agreed to plan.   Labs Review Labs Reviewed - No data to display  Imaging Review No results found.    MDM   Final diagnoses:  Lymphadenitis    Sx are mild, focal and not assocaited with any visulaized infection - pt appears stable without trismus, torticollis or meningismus.  VS normal - will return for recheck if sx worsen.  Meds given in ED:  Medications  cephALEXin (KEFLEX) capsule 500 mg (not administered)  ibuprofen (ADVIL,MOTRIN) tablet 800 mg (not administered)    New Prescriptions   CEPHALEXIN (KEFLEX)  500 MG CAPSULE    Take 1 capsule (500 mg total) by mouth 4 (four) times daily.   IBUPROFEN (ADVIL,MOTRIN) 800 MG TABLET    Take 1 tablet (800 mg total) by mouth 3 (three) times daily.     I personally performed the services described in this documentation, which was scribed in my presence. The recorded information has been reviewed and is accurate.       Vida Roller, MD 05/01/14 2032

## 2014-05-01 NOTE — ED Notes (Signed)
Pt c/o lump to right side of chin with pain that radiates around to his head x 2 days

## 2014-08-01 IMAGING — CR DG CHEST 2V
1 series · 4 of 4 positions shown · non-contrast
Comparison: none

REASON FOR EXAM: [id number]
COMMENTS:

PROCEDURE:     DXR - DXR CHEST PA (OR AP) AND LATERAL  - September 24, 2012 [DATE]
RESULT:     Comparison: None.

[Series 1: pa · 0.17mm/px · 4 of 4 slices shown]
[im 1/4]
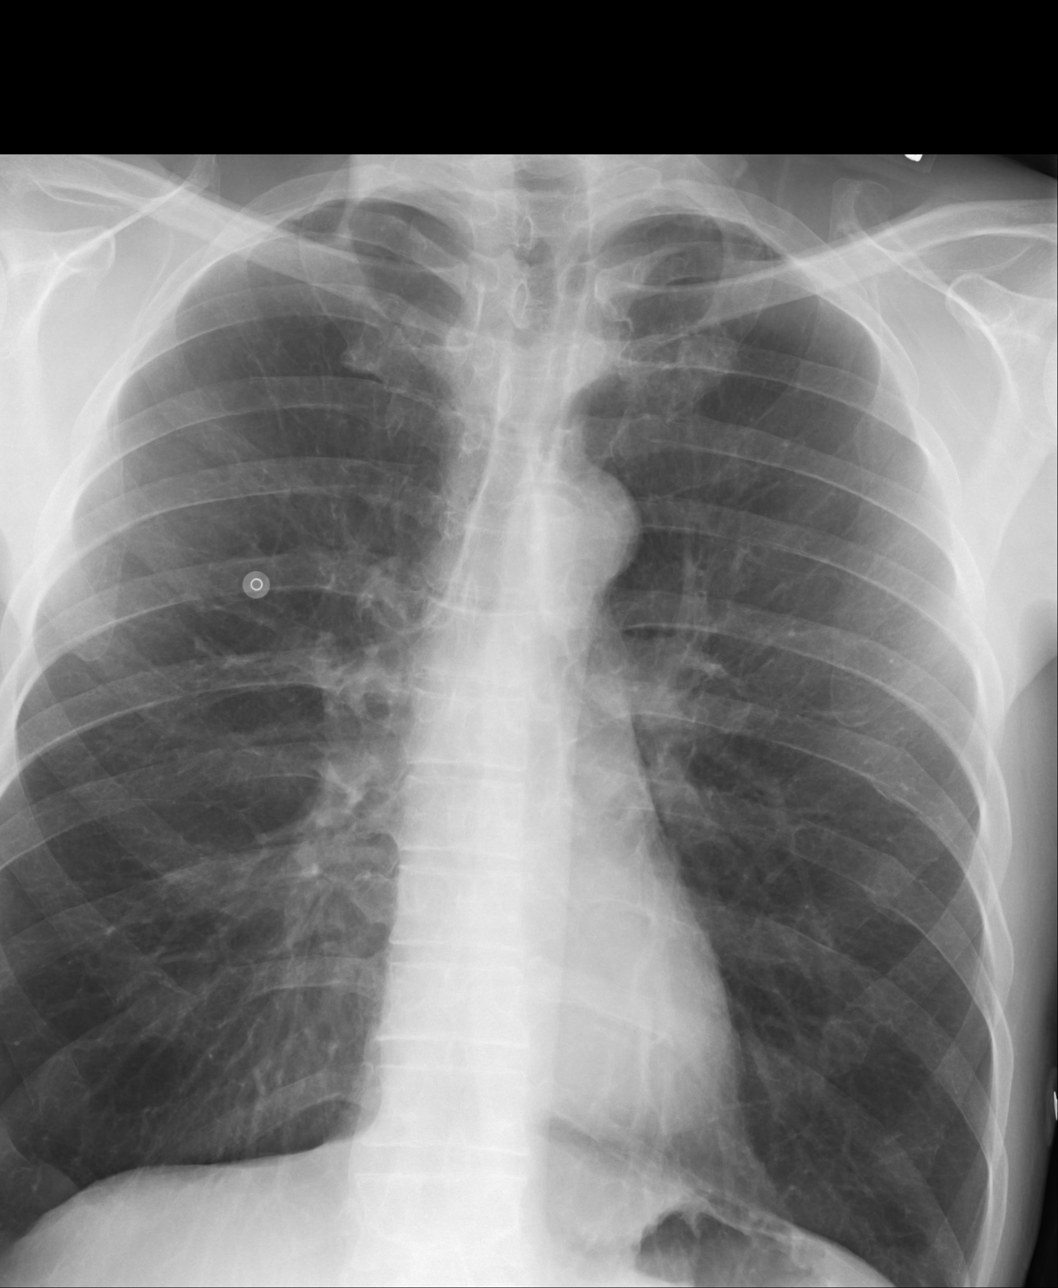
[im 2/4]
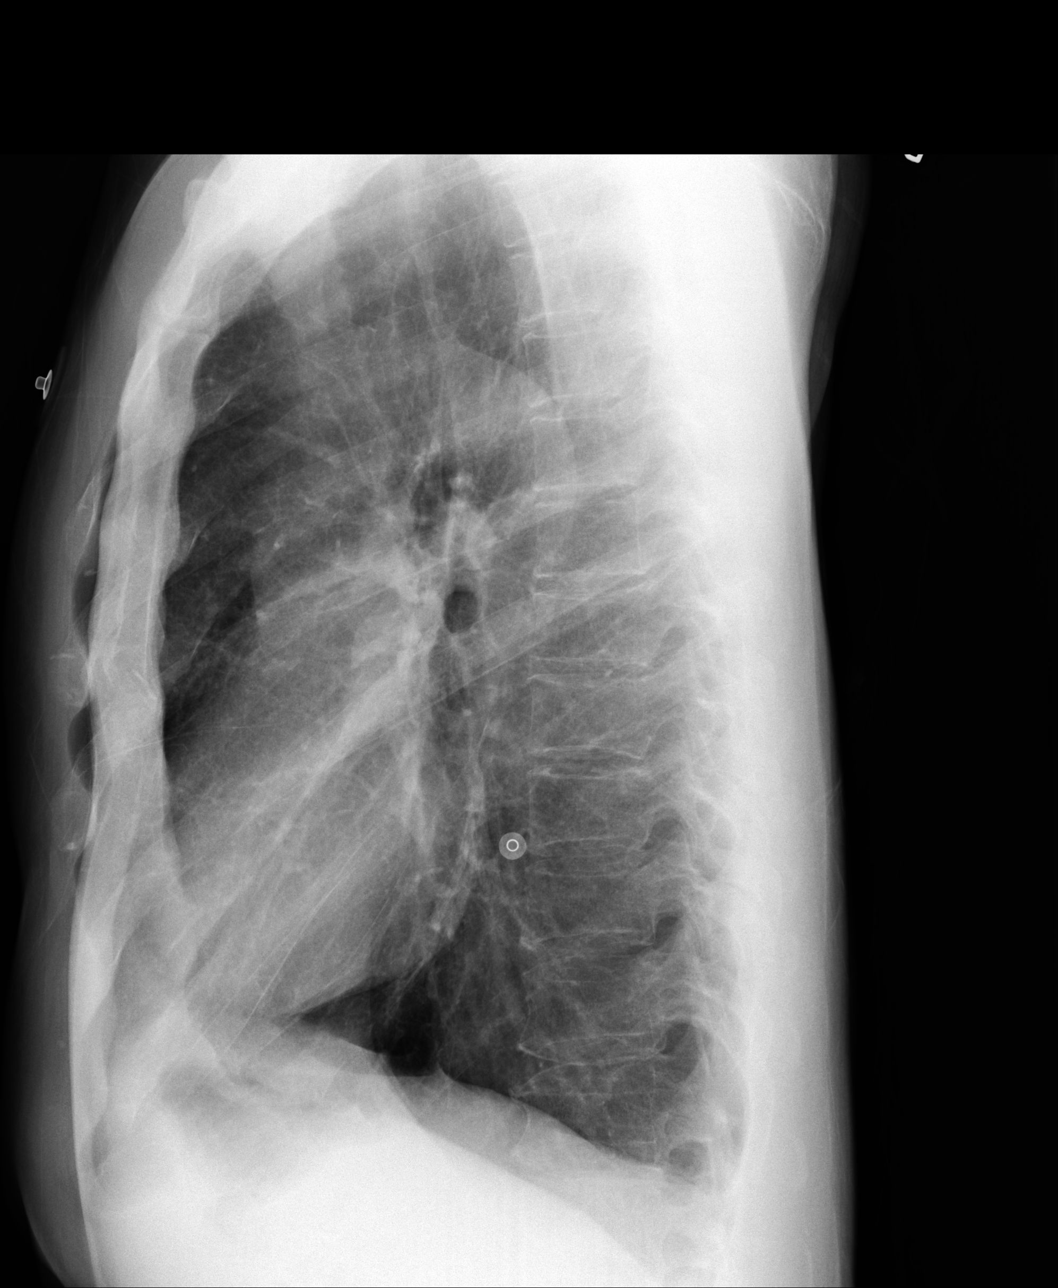
[im 3/4]
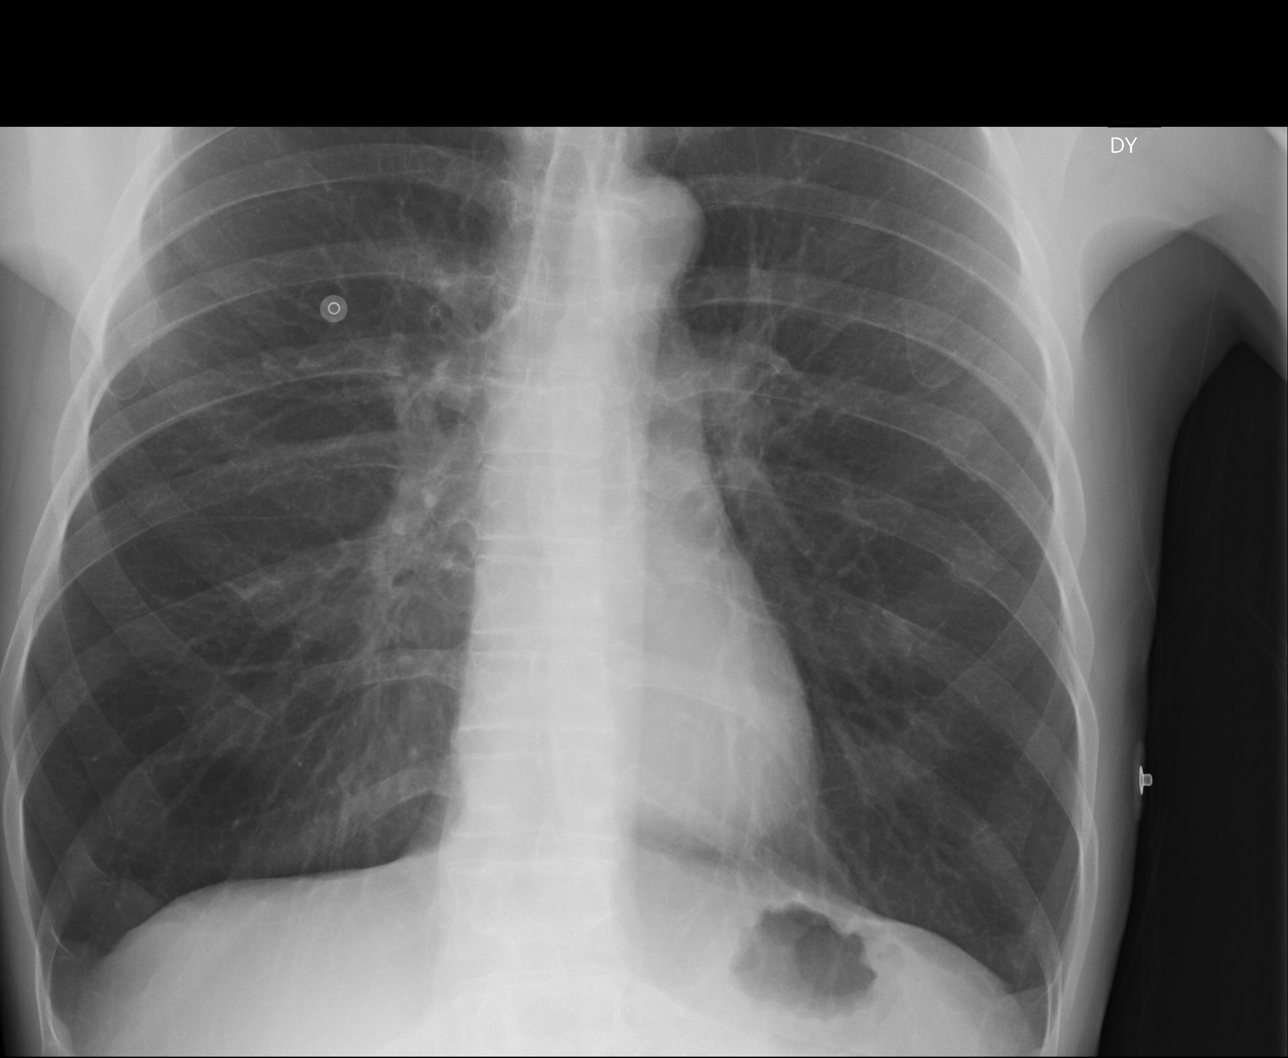
[im 4/4]
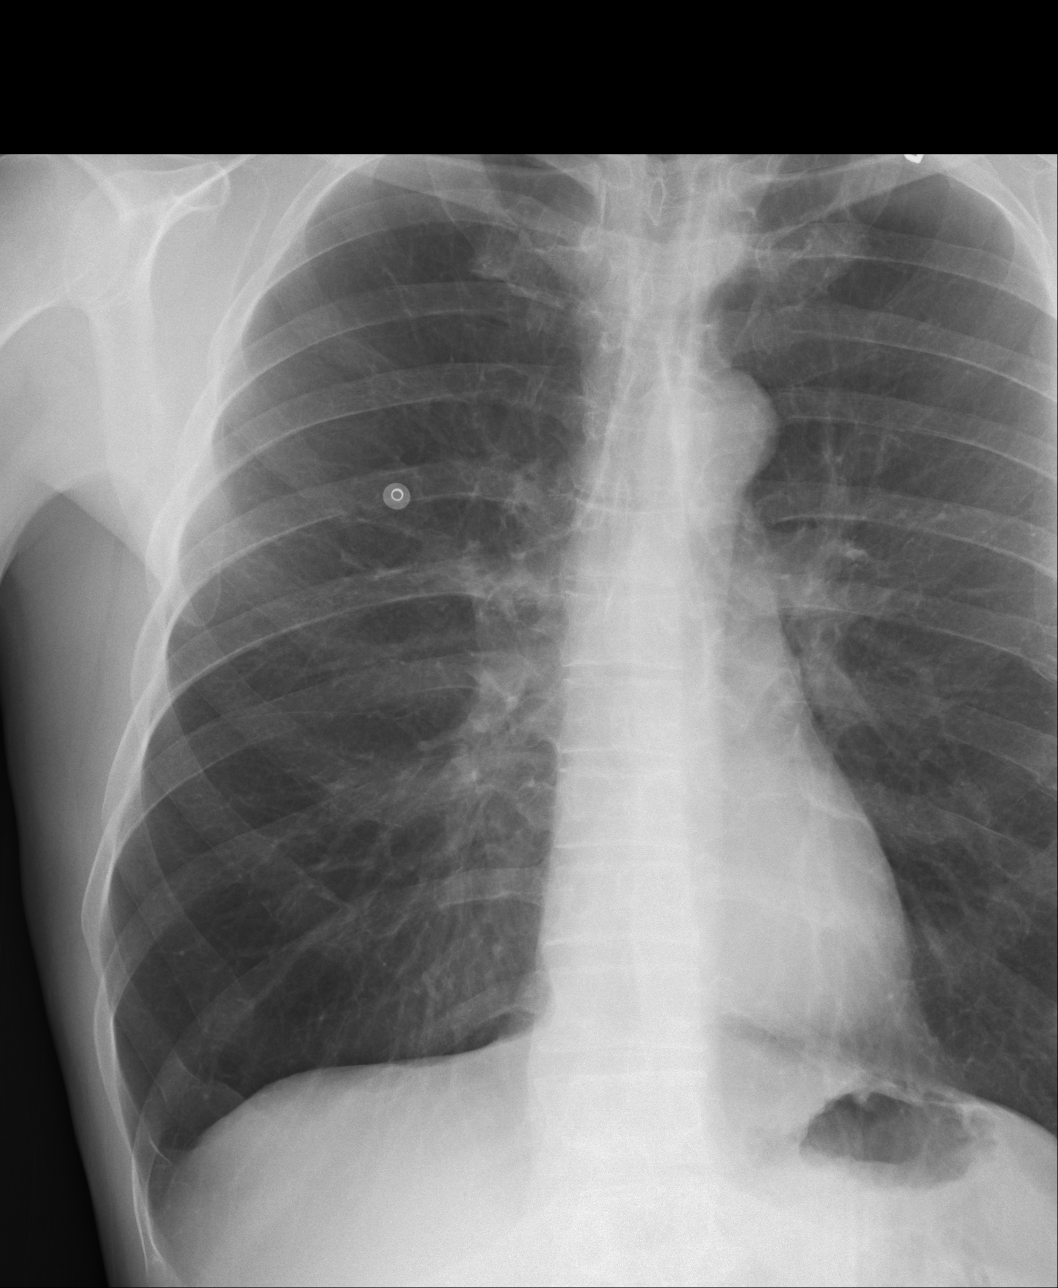

[4 of 4 positions shown; findings below may reference images not displayed]

FINDINGS: The heart and mediastinum are within normal limits. The lungs are
hyperinflated. No focal pulmonary opacities.
IMPRESSION: Findings of COPD. Otherwise, no acute cardiopulmonary disease.

[REDACTED]
